# Patient Record
Sex: Male | Born: 2000 | Race: Black or African American | Hispanic: No | Smoking: Never smoker
Health system: Southern US, Community
[De-identification: ages and names within clinical notes are randomized; demographics above are authoritative.]

---

## 2019-06-03 ENCOUNTER — Other Ambulatory Visit: Payer: Self-pay

## 2019-06-03 ENCOUNTER — Encounter (HOSPITAL_COMMUNITY): Payer: Self-pay

## 2019-06-03 ENCOUNTER — Emergency Department (HOSPITAL_COMMUNITY)
Admission: EM | Admit: 2019-06-03 | Discharge: 2019-06-03 | Disposition: A | Discharge status: Home / Self Care | Payer: 59 | Attending: Emergency Medicine | Admitting: Emergency Medicine

## 2019-06-03 DIAGNOSIS — R262 Difficulty in walking, not elsewhere classified: Secondary | ICD-10-CM | POA: Diagnosis not present

## 2019-06-03 DIAGNOSIS — R0789 Other chest pain: Secondary | ICD-10-CM | POA: Insufficient documentation

## 2019-06-03 DIAGNOSIS — R079 Chest pain, unspecified: Secondary | ICD-10-CM

## 2019-06-03 LAB — CBC
HCT: 44.9 % (ref 39.0–52.0)
Hemoglobin: 14.8 g/dL (ref 13.0–17.0)
MCH: 28.8 pg (ref 26.0–34.0)
MCHC: 33 g/dL (ref 30.0–36.0)
MCV: 87.5 fL (ref 80.0–100.0)
Platelets: 258 10*3/uL (ref 150–400)
RBC: 5.13 MIL/uL (ref 4.22–5.81)
RDW: 13.5 % (ref 11.5–15.5)
WBC: 6.5 10*3/uL (ref 4.0–10.5)
nRBC: 0 % (ref 0.0–0.2)

## 2019-06-03 LAB — BASIC METABOLIC PANEL
Anion gap: 8 (ref 5–15)
BUN: 12 mg/dL (ref 6–20)
CO2: 27 mmol/L (ref 22–32)
Calcium: 10 mg/dL (ref 8.9–10.3)
Chloride: 104 mmol/L (ref 98–111)
Creatinine, Ser: 0.95 mg/dL (ref 0.61–1.24)
GFR calc Af Amer: >60 mL/min (ref >60)
GFR calc non Af Amer: >60 mL/min (ref >60)
Glucose, Bld: 94 mg/dL (ref 70–99)
Potassium: 3.7 mmol/L (ref 3.5–5.1)
Sodium: 139 mmol/L (ref 135–145)

## 2019-06-03 LAB — TROPONIN I (HIGH SENSITIVITY)
Troponin I (High Sensitivity): 5 ng/L (ref <18)
Troponin I (High Sensitivity): 6 ng/L (ref <18)

## 2019-06-03 MED ORDER — SODIUM CHLORIDE 0.9% FLUSH: 3.0000 mL | Freq: Once | INTRAVENOUS | Status: DC

## 2019-06-03 NOTE — ED Provider Notes (Signed)
[name] EMERGENCY DEPARTMENT Provider Note   CSN: [id number] Arrival date & time: 06/03/19  1523     History Chief Complaint  Patient presents with  . Chest Pain    Kent Wade is a 19 y.o. male.  19 year old male presents with complaint of ongoing chest pain.  Patient states he first developed chest pain in June of last year, states at that time he was also unable to walk for about a month and a half.  Patient has had extensive work-up with neurology, cardiology, hematology.  Patient states that he was told that he may have lymphoma or some autoimmune disorder however all of his tests so far have been normal.  Patient states that he continues to have a tightness on the left side of his chest described as muscle soreness from working out, pain is reproduced with palpation of the left chest, states sometimes sleeping helps with his pain as well.  Patient states that at times if he takes 4 5 deep breaths he feels a pop in his chest and then his pain improves.  No changes in his chronic/recurrent chest pain today.  Patient went to the emergency room in [place] last night due to ongoing pain in his chest however left after several hours.  Patient was contacted last night and told that his EKG was abnormal and he should present to the emergency room which prompted him to come to the ER today.        History reviewed. No pertinent past medical history.  There are no problems to display for this patient.   History reviewed. No pertinent surgical history.     No family history on file.  Social History   Tobacco Use  . Smoking status: Not on file  Substance Use Topics  . Alcohol use: Not on file  . Drug use: Not on file    Home Medications Prior to Admission medications   Not on File    Allergies    Patient has no known allergies.  Review of Systems   Review of Systems  Constitutional: Negative for fever.  HENT: Negative for congestion.     Respiratory: Positive for shortness of breath. Negative for cough.   Cardiovascular: Positive for chest pain. Negative for palpitations and leg swelling.  Gastrointestinal: Negative for nausea and vomiting.  Musculoskeletal: Negative for arthralgias and myalgias.  Skin: Negative for rash and wound.  Allergic/Immunologic: Negative for immunocompromised state.  Psychiatric/Behavioral: Negative for confusion.  All other systems reviewed and are negative.   Physical Exam Updated Vital Signs BP (!) 128/106   Pulse 62   Temp 98.1 F (36.7 C) (Oral)   Resp 16   SpO2 100%   Physical Exam Vitals and nursing note reviewed.  Constitutional:      General: He is not in acute distress.    Appearance: He is well-developed. He is not diaphoretic.  HENT:     Head: Normocephalic and atraumatic.  Cardiovascular:     Rate and Rhythm: Normal rate and regular rhythm.     Heart sounds: Normal heart sounds.  Pulmonary:     Effort: Pulmonary effort is normal.     Breath sounds: Normal breath sounds. No decreased breath sounds.  Chest:     Chest wall: Tenderness present.    Abdominal:     Palpations: Abdomen is soft.     Tenderness: There is no abdominal tenderness.  Musculoskeletal:     Right lower leg: No edema.  Left lower leg: No edema.  Skin:    General: Skin is warm and dry.     Findings: No erythema or rash.  Neurological:     Mental Status: He is alert and oriented to person, place, and time.  Psychiatric:        Behavior: Behavior normal.     ED Results / Procedures / Treatments   Labs (all labs ordered are listed, but only abnormal results are displayed) Labs Reviewed  BASIC METABOLIC PANEL  CBC  TROPONIN I (HIGH SENSITIVITY)  TROPONIN I (HIGH SENSITIVITY)    EKG EKG Interpretation  Date/Time:  Friday June 03 2019 15:34:20 EDT Ventricular Rate:  73 PR Interval:  164 QRS Duration: 90 QT Interval:  422 QTC Calculation: 464 R Axis:   98 Text  Interpretation: Sinus rhythm abnormal flipped biphasic anterior t wave inferior flipped t wave. abnormal, no old available Confirmed by Charlesetta Shanks [phone]) on 06/03/2019 5:20:01 PM   Radiology No results found.  Procedures Procedures (including critical care time)  Medications Ordered in ED Medications  sodium chloride flush (NS) 0.9 % injection 3 mL (3 mLs Intravenous Not Given 06/03/19 1934)    ED Course  I have reviewed the triage vital signs and the nursing notes.  Pertinent labs & imaging results that were available during my care of the patient were reviewed by me and considered in my medical decision making (see chart for details).  Clinical Course as of Jun 02 2120  Fri Jun 02, 6033  3960 19 year old male with complaint of chest pain since June of last year without changes in his chronic or baseline discomfort.  Patient was concerned because he went to an ER in [place] last night, had an EKG done and did not stay for evaluation, was contacted by the emergency room, told he had abnormal EKG to go to the ER.  Review of patient's EKG today, he does have flipped T waves however after further review of his extensive records on file through care everywhere, note from cardiology with [facility] commenting on same deep flipped T waves on prior EKG. Patient is also had an echo as well as CT scan of his chest. Case discussed with Dr. Vallery Ridge, ER attending, recommends consult with cardiology.  Case discussed with Dr. Ahmed Prima on-call with cardiology who has reviewed patient's records and EKG and recommends delta troponin, if no significant change, patient can follow-up with his cardiologist.   [LM]  2122 No significant change from initial troponin of 5-2nd troponin at 6.  Patient be discharged follow-up with his cardiologist.   [LM]    Clinical Course User Index [LM] Roque Lias   MDM Rules/Calculators/A&P                      Final Clinical Impression(s) / ED Diagnoses Final  diagnoses:  Chest pain, unspecified type    Rx / DC Orders ED Discharge Orders    None       Roque Lias 06/03/19 2122    Charlesetta Shanks, MD 06/13/19 [phone]

## 2019-06-03 NOTE — Discharge Instructions (Addendum)
Follow-up with your cardiologist in [place].  Present to emergency room for worsening or concerning symptoms.

## 2019-06-03 NOTE — ED Notes (Signed)
Discharge instructions discussed with pt and family at bedside. Pt verbalized understanding with no questions at this time. Pt to follow up with [facility] cardiology.

## 2019-06-03 NOTE — ED Triage Notes (Addendum)
Patient complains of chest pain that he describes as intermittently since June. Seen yesterday in [place] but didn't stay. NAD. No cough, no congestion. States has been several times in past several months for same

## 2020-04-04 ENCOUNTER — Encounter (HOSPITAL_COMMUNITY): Payer: Self-pay | Admitting: Emergency Medicine

## 2020-04-04 ENCOUNTER — Emergency Department (HOSPITAL_COMMUNITY): Payer: 59

## 2020-04-04 ENCOUNTER — Other Ambulatory Visit: Payer: Self-pay

## 2020-04-04 ENCOUNTER — Emergency Department (HOSPITAL_COMMUNITY)
Admission: EM | Admit: 2020-04-04 | Discharge: 2020-04-04 | Disposition: A | Discharge status: Home / Self Care | Payer: 59 | Attending: Emergency Medicine | Admitting: Emergency Medicine

## 2020-04-04 DIAGNOSIS — R079 Chest pain, unspecified: Secondary | ICD-10-CM | POA: Diagnosis present

## 2020-04-04 DIAGNOSIS — Z5321 Procedure and treatment not carried out due to patient leaving prior to being seen by health care provider: Secondary | ICD-10-CM | POA: Insufficient documentation

## 2020-04-04 LAB — CBC
HCT: 42.4 % (ref 39.0–52.0)
Hemoglobin: 14.5 g/dL (ref 13.0–17.0)
MCH: 29.4 pg (ref 26.0–34.0)
MCHC: 34.2 g/dL (ref 30.0–36.0)
MCV: 86 fL (ref 80.0–100.0)
Platelets: 254 10*3/uL (ref 150–400)
RBC: 4.93 MIL/uL (ref 4.22–5.81)
RDW: 13.2 % (ref 11.5–15.5)
WBC: 6.9 10*3/uL (ref 4.0–10.5)
nRBC: 0 % (ref 0.0–0.2)

## 2020-04-04 LAB — BASIC METABOLIC PANEL
Anion gap: 10 (ref 5–15)
BUN: 12 mg/dL (ref 6–20)
CO2: 25 mmol/L (ref 22–32)
Calcium: 9.5 mg/dL (ref 8.9–10.3)
Chloride: 102 mmol/L (ref 98–111)
Creatinine, Ser: 1.07 mg/dL (ref 0.61–1.24)
GFR, Estimated: >60 mL/min (ref >60)
Glucose, Bld: 108 mg/dL — ABNORMAL HIGH (ref 70–99)
Potassium: 3.7 mmol/L (ref 3.5–5.1)
Sodium: 137 mmol/L (ref 135–145)

## 2020-04-04 LAB — TROPONIN I (HIGH SENSITIVITY): Troponin I (High Sensitivity): 4 ng/L (ref <18)

## 2020-04-04 NOTE — ED Notes (Signed)
Pt had a friend come in to let us know he was LWBS

## 2020-04-04 NOTE — ED Triage Notes (Signed)
Patient reports intermittent left chest pain onset last month , denies SOB , no emesis or diaphoresis , denies fever or cough .

## 2020-07-18 ENCOUNTER — Emergency Department (HOSPITAL_COMMUNITY): Payer: 59

## 2020-07-18 ENCOUNTER — Other Ambulatory Visit: Payer: Self-pay

## 2020-07-18 ENCOUNTER — Ambulatory Visit (HOSPITAL_COMMUNITY)
Admission: EM | Admit: 2020-07-18 | Discharge: 2020-07-18 | Disposition: A | Discharge status: Other Acute Inpatient Hospital | Payer: 59

## 2020-07-18 ENCOUNTER — Encounter (HOSPITAL_COMMUNITY): Payer: Self-pay

## 2020-07-18 ENCOUNTER — Emergency Department (HOSPITAL_COMMUNITY)
Admission: EM | Admit: 2020-07-18 | Discharge: 2020-07-19 | Disposition: A | Discharge status: Home / Self Care | Payer: 59 | Attending: Emergency Medicine | Admitting: Emergency Medicine

## 2020-07-18 DIAGNOSIS — R519 Headache, unspecified: Secondary | ICD-10-CM | POA: Diagnosis not present

## 2020-07-18 DIAGNOSIS — Z91010 Allergy to peanuts: Secondary | ICD-10-CM | POA: Diagnosis not present

## 2020-07-18 DIAGNOSIS — R070 Pain in throat: Secondary | ICD-10-CM

## 2020-07-18 DIAGNOSIS — J039 Acute tonsillitis, unspecified: Secondary | ICD-10-CM | POA: Insufficient documentation

## 2020-07-18 DIAGNOSIS — J029 Acute pharyngitis, unspecified: Secondary | ICD-10-CM | POA: Diagnosis present

## 2020-07-18 LAB — CBC WITH DIFFERENTIAL/PLATELET
Abs Immature Granulocytes: 0.09 10*3/uL — ABNORMAL HIGH (ref 0.00–0.07)
Basophils Absolute: 0 10*3/uL (ref 0.0–0.1)
Basophils Relative: 0 %
Eosinophils Absolute: 0 10*3/uL (ref 0.0–0.5)
Eosinophils Relative: 0 %
HCT: 44.5 % (ref 39.0–52.0)
Hemoglobin: 14.6 g/dL (ref 13.0–17.0)
Immature Granulocytes: 1 %
Lymphocytes Relative: 15 %
Lymphs Abs: 2.2 10*3/uL (ref 0.7–4.0)
MCH: 28.7 pg (ref 26.0–34.0)
MCHC: 32.8 g/dL (ref 30.0–36.0)
MCV: 87.6 fL (ref 80.0–100.0)
Monocytes Absolute: 1.4 10*3/uL — ABNORMAL HIGH (ref 0.1–1.0)
Monocytes Relative: 10 %
Neutro Abs: 10.4 10*3/uL — ABNORMAL HIGH (ref 1.7–7.7)
Neutrophils Relative %: 74 %
Platelets: 249 10*3/uL (ref 150–400)
RBC: 5.08 MIL/uL (ref 4.22–5.81)
RDW: 13.9 % (ref 11.5–15.5)
WBC: 14.1 10*3/uL — ABNORMAL HIGH (ref 4.0–10.5)
nRBC: 0 % (ref 0.0–0.2)

## 2020-07-18 LAB — BASIC METABOLIC PANEL
Anion gap: 8 (ref 5–15)
BUN: 9 mg/dL (ref 6–20)
CO2: 25 mmol/L (ref 22–32)
Calcium: 10.1 mg/dL (ref 8.9–10.3)
Chloride: 102 mmol/L (ref 98–111)
Creatinine, Ser: 1.15 mg/dL (ref 0.61–1.24)
GFR, Estimated: >60 mL/min (ref >60)
Glucose, Bld: 94 mg/dL (ref 70–99)
Potassium: 3.9 mmol/L (ref 3.5–5.1)
Sodium: 135 mmol/L (ref 135–145)

## 2020-07-18 LAB — GROUP A STREP BY PCR: Group A Strep by PCR: NOT DETECTED

## 2020-07-18 MED ORDER — IBUPROFEN 100 MG/5ML PO SUSP
600.0000 mg | Freq: Once | ORAL | Status: AC
  Administered 2020-07-18: 600 mg via ORAL

## 2020-07-18 MED ORDER — DEXAMETHASONE SODIUM PHOSPHATE 10 MG/ML IJ SOLN
10.0000 mg | Freq: Once | INTRAMUSCULAR | Status: AC
  Administered 2020-07-18: 10 mg via INTRAMUSCULAR
  Filled 2020-07-18: qty 1

## 2020-07-18 MED ORDER — ACETAMINOPHEN 160 MG/5ML PO SOLN
650.0000 mg | Freq: Once | ORAL | Status: AC
  Administered 2020-07-18: 650 mg via ORAL
  Filled 2020-07-18: qty 20.3

## 2020-07-18 MED ORDER — IBUPROFEN 100 MG/5ML PO SUSP
ORAL | Status: AC
  Filled 2020-07-18: qty 5

## 2020-07-18 MED ORDER — AMOXICILLIN-POT CLAVULANATE 400-57 MG/5ML PO SUSR
875.0000 mg | Freq: Once | ORAL | Status: AC
  Administered 2020-07-19: 875 mg via ORAL
  Filled 2020-07-18: qty 10.9

## 2020-07-18 MED ORDER — IBUPROFEN 100 MG/5ML PO SUSP
600.0000 mg | Freq: Once | ORAL | Status: AC
  Administered 2020-07-19: 600 mg via ORAL
  Filled 2020-07-18: qty 30

## 2020-07-18 MED ORDER — DEXAMETHASONE SODIUM PHOSPHATE 10 MG/ML IJ SOLN
6.0000 mg | Freq: Once | INTRAMUSCULAR | Status: AC
  Administered 2020-07-18: 6 mg via INTRAVENOUS
  Filled 2020-07-18: qty 1

## 2020-07-18 MED ORDER — AMOXICILLIN-POT CLAVULANATE 400-57 MG/5ML PO SUSR: 875.0000 mg | Freq: Two times a day (BID) | ORAL | 0 refills | Status: AC

## 2020-07-18 MED ORDER — SODIUM CHLORIDE 0.9 % IV BOLUS
2000.0000 mL | Freq: Once | INTRAVENOUS | Status: AC
  Administered 2020-07-18: 2000 mL via INTRAVENOUS

## 2020-07-18 NOTE — ED Provider Notes (Signed)
[name] EMERGENCY DEPARTMENT Provider Note   CSN: [id number] Arrival date & time: 07/18/20  1437     History No chief complaint on file.   Kent Wade is a 20 y.o. male with no pertinent past medical history presents today for evaluation of 3 days of sore throat, headache, bilateral ear pain and throat pain.  He states that over the past 2 to 3 days he has had worsening pain and difficulty swallowing his saliva.  He denies any shortness of breath.  He was febrile here at 100.4, however denies checking his temperature at home or prior to arrival.  He denies any history of similar. He denies any known sick contacts.  No reported rashes.  He states that he has not had any known mono exposures or any concern for STI.  HPI     History reviewed. No pertinent past medical history.  There are no problems to display for this patient.   History reviewed. No pertinent surgical history.     No family history on file.  Social History   Tobacco Use  . Smoking status: Never Smoker  . Smokeless tobacco: Never Used  Substance Use Topics  . Alcohol use: Never  . Drug use: Never    Home Medications Prior to Admission medications   Medication Sig Start Date End Date Taking? Authorizing Provider  amoxicillin-clavulanate (AUGMENTIN) 400-57 MG/5ML suspension Take 10.9 mLs (875 mg total) by mouth 2 (two) times daily for 10 days. 07/18/20 07/28/20 Yes Cristina Gong, PA-C  cetirizine (ZYRTEC) 10 MG tablet Take 10 mg by mouth daily as needed for allergies.   Yes [provider]  ibuprofen (ADVIL) 200 MG tablet Take 400 mg by mouth every 6 (six) hours as needed for headache or moderate pain.   Yes [provider]    Allergies    Chocolate flavor and Peanut butter flavor  Review of Systems   Review of Systems  Constitutional: Positive for chills and fever.  HENT: Positive for ear pain, sore throat, trouble swallowing and voice change.   Eyes:  Negative for visual disturbance.  Respiratory: Negative for chest tightness and shortness of breath.   Cardiovascular: Negative for chest pain.  Gastrointestinal: Negative for abdominal pain, nausea and vomiting.  Genitourinary: Negative for dysuria.  Neurological: Positive for headaches.  All other systems reviewed and are negative.   Physical Exam Updated Vital Signs BP 127/74   Pulse 64   Temp 97.8 F (36.6 C) (Oral)   Resp 16   SpO2 100%   Physical Exam Vitals and nursing note reviewed.  Constitutional:      General: He is not in acute distress.    Appearance: He is not diaphoretic.  HENT:     Head: Normocephalic and atraumatic.     Mouth/Throat:     Comments: Patient has significant trismus, unable to clearly view tonsils or uvula.  He has muffled/hot potato sounding voice. Eyes:     General: No scleral icterus.       Right eye: No discharge.        Left eye: No discharge.     Conjunctiva/sclera: Conjunctivae normal.  Cardiovascular:     Rate and Rhythm: Normal rate and regular rhythm.  Pulmonary:     Effort: Pulmonary effort is normal. No respiratory distress.     Breath sounds: No stridor.  Abdominal:     General: There is no distension.  Musculoskeletal:        General: No deformity.  Cervical back: Normal range of motion.  Skin:    General: Skin is warm and dry.  Neurological:     Mental Status: He is alert.     Motor: No abnormal muscle tone.  Psychiatric:        Behavior: Behavior normal.     ED Results / Procedures / Treatments   Labs (all labs ordered are listed, but only abnormal results are displayed) Labs Reviewed  CBC WITH DIFFERENTIAL/PLATELET - Abnormal; Notable for the following components:      Result Value   WBC 14.1 (*)    Neutro Abs 10.4 (*)    Monocytes Absolute 1.4 (*)    Abs Immature Granulocytes 0.09 (*)    All other components within normal limits  GROUP A STREP BY PCR  BASIC METABOLIC PANEL     EKG None  Radiology CT Soft Tissue Neck Wo Contrast  Result Date: 07/18/2020 CLINICAL DATA:  Initial evaluation for acute sore throat for 3 days, epiglottitis or tonsillitis suspected. EXAM: CT NECK WITHOUT CONTRAST TECHNIQUE: Multidetector CT imaging of the neck was performed following the standard protocol without intravenous contrast. COMPARISON:  None available. FINDINGS: Pharynx and larynx: Examination somewhat technically limited by motion artifact and lack of IV contrast. Oral cavity within normal limits. No acute inflammatory changes seen about the dentition. Tonsils are mildly prominent and hypertrophied bilaterally, with the right slightly larger than the left. No visible discrete tonsillar or peritonsillar abscess. Parapharyngeal fat fairly well maintained. Remainder of the nasopharynx and oropharynx within normal limits. No retropharyngeal collection or swelling. Epiglottis grossly within normal limits on this motion degraded exam. Remainder of the hypopharynx and supraglottic larynx within normal limits. Glottis normal. Subglottic airway patent clear. Salivary glands: Salivary glands including the parotid and submandibular glands are grossly within normal limits allowing for motion and lack of IV contrast. Thyroid: Normal. Lymph nodes: No visible enlarged or pathologic adenopathy within the neck. Vascular: Evaluation of the vascular structures limited by lack of IV contrast. Limited intracranial: Unremarkable. Visualized orbits: Unremarkable. Mastoids and visualized paranasal sinuses: Minimal mucosal thickening within the left sphenoid sinus. Visualized paranasal sinuses are otherwise clear. Visualized mastoid air cells and middle ear cavities are well pneumatized and free of fluid. Skeleton: No acute osseous finding. No discrete or worrisome osseous lesions. Upper chest: Visualized upper chest demonstrates no acute finding. Visualized lungs are clear. Other: None. IMPRESSION: 1. Mildly  prominent and hypertrophied palatine tonsils bilaterally, nonspecific, but can be seen in the setting of acute tonsillitis. No discrete tonsillar or peritonsillar abscess. 2. No other acute abnormality identified within the neck. Electronically Signed   By: Rise Mu M.D.   On: 07/18/2020 18:57    Procedures Procedures   Medications Ordered in ED Medications  dexamethasone (DECADRON) injection 10 mg (10 mg Intramuscular Given 07/18/20 1506)  ibuprofen (ADVIL) 100 MG/5ML suspension 600 mg (600 mg Oral Given 07/18/20 1506)  sodium chloride 0.9 % bolus 2,000 mL (0 mLs Intravenous Stopped 07/19/20 0028)  acetaminophen (TYLENOL) 160 MG/5ML solution 650 mg (650 mg Oral Given 07/18/20 1825)  dexamethasone (DECADRON) injection 6 mg (6 mg Intravenous Given 07/18/20 2121)  amoxicillin-clavulanate (AUGMENTIN) 400-57 MG/5ML suspension 875 mg (875 mg Oral Given 07/19/20 0025)  ibuprofen (ADVIL) 100 MG/5ML suspension 600 mg (600 mg Oral Given 07/19/20 0026)    ED Course  I have reviewed the triage vital signs and the nursing notes.  Pertinent labs & imaging results that were available during my care of the patient were reviewed by me  and considered in my medical decision making (see chart for details).  Clinical Course as of 07/20/20 1547  Wed Jul 18, 2020  2028 I went to reevaluate patient.  It appears that his IV has been nonfunctional and he has not gotten more than 100 cc of the 2 L fluids I ordered.  Asked RN to troubleshoot IV and start new 1 if needed. [EH]  2206 Patient is reevaluated, he says that his throat does not hurt anymore.  He has gotten about one of the 2 L that I have ordered.  He is again instructed to keep his arm straight so that it will flow well.  He is no longer constantly spitting up his saliva. [EH]  2258 Patient is reevaluated.  He has now received about 1.5 of the 2 L.  He still looks better, has better phonation and still feels like he is having to spit his saliva out.   He is given ice chips to try and swallow.   [EH]    Clinical Course User Index [EH] Norman Clay   MDM Rules/Calculators/A&P                         Patient is a 20 year old man who presents today for evaluation of sore throat. Patient had significant trismus limiting ability to evaluate his throat and was not tolerating secretions.  He was given IM Decadron in triage.  It took him a long time to receive his IV fluids extending his care in the ER. While in the ER he was given an extra 6 mg IV Decadron due to continued difficulty swallowing, he was given 2 doses of ibuprofen, dose of Tylenol.  CT scan was obtained which did not show abscess or drainable fluid collection.  His strep test was negative, however he does have mild leukocytosis and is borderline febrile here.  His airway is intact, he is able to breathe without difficulty, no stridor, and is 100% on room air.  He is given p.o. antibiotics. At this point patient is still having difficulty swallowing his saliva.  He additionally still needs completion of his IV fluids.  At shift change care was transferred to [facility] who will follow pending studies, re-evaulate and determine disposition.     Note: Portions of this report may have been transcribed using voice recognition software. Every effort was made to ensure accuracy; however, inadvertent computerized transcription errors may be present  Final Clinical Impression(s) / ED Diagnoses Final diagnoses:  Throat pain  Tonsillitis    Rx / DC Orders ED Discharge Orders         Ordered    amoxicillin-clavulanate (AUGMENTIN) 400-57 MG/5ML suspension  2 times daily        07/18/20 2314           Cristina Gong, Cordelia Poche 07/20/20 1550    Pricilla Loveless, MD 07/21/20 [phone]

## 2020-07-18 NOTE — ED Triage Notes (Signed)
Patient sent from urgent care for further evaluation of sore throat x 3 days and unable to eat and drink. Patient has had fever for same and complains of headache and bilateral ear pain

## 2020-07-18 NOTE — ED Notes (Signed)
Attempted to call report to ED.  

## 2020-07-18 NOTE — ED Provider Notes (Signed)
Emergency Medicine Provider Triage Evaluation Note  Kent Wade , a 20 y.o. male  was evaluated in triage.  Pt complains of sore throat, headache, ear pain. Sen at urgent care pta and sent here for hydration and further eval. States he hasnt eaten in 3 days because he cannot swallow anything including liquids.  Review of Systems  Positive: Sore throat, ear pain, headache Negative: cough  Physical Exam  BP (!) 147/74   Pulse 98   Temp 100 F (37.8 C)   SpO2 100%  Gen:   Awake, no distress   Resp:  Normal effort  MSK:   Moves extremities without difficulty  Other:  Airway patent, trismus noted which limits exam, no obvious unilateral tonsillar swelling but tonsillar edema is noted bilaterally  Medical Decision Making  Medically screening exam initiated at 2:54 PM.  Appropriate orders placed.  Kent Wade was informed that the remainder of the evaluation will be completed by another provider, this initial triage assessment does not replace that evaluation, and the importance of remaining in the ED until their evaluation is complete.    Kent Wade 07/18/20 1506    Jacalyn Lefevre, MD 07/19/20 1725

## 2020-07-18 NOTE — ED Triage Notes (Signed)
Pt presents unable to talk due to swelling in throat . Richrd Prime ,PA in room to assess Pt needs. Pt is texting on cell phone to give answers.Pt unable to open mouth fully for exam. Pt A/O no resp distress noted.

## 2020-07-18 NOTE — Discharge Instructions (Addendum)
Please take Ibuprofen (Advil, motrin) and Tylenol (acetaminophen) to relieve your pain.    You may take up to 600 MG (3 pills) of normal strength ibuprofen every 8 hours as needed.   You make take tylenol, up to 1,000 mg (two extra strength pills) every 8 hours as needed.  These medications also come in a liquid form if you do not feel that you are able to swallow a pill.  It is safe to take ibuprofen and tylenol at the same time as they work differently.   Do not take more than 3,000 mg tylenol in a 24 hour period (not more than one dose every 8 hours.  Please check all medication labels as many medications such as pain and cold medications may contain tylenol.  Do not drink alcohol while taking these medications.  Do not take other NSAID'S while taking ibuprofen (such as aleve or naproxen).  Please take ibuprofen with food to decrease stomach upset.  You may have diarrhea from the antibiotics.  It is very important that you continue to take the antibiotics even if you get diarrhea unless a medical professional tells you that you may stop taking them.  If you stop too early the bacteria you are being treated for will become stronger and you may need different, more powerful antibiotics that have more side effects and worsening diarrhea.  Please stay well hydrated and consider probiotics as they may decrease the severity of your diarrhea.

## 2020-07-19 NOTE — ED Provider Notes (Signed)
20 year old male received at sign out from [facility] pending medications and reevaluation.  Per her HPI:  "Kent Wade is a 20 y.o. male with no pertinent past medical history presents today for evaluation of 3 days of sore throat, headache, bilateral ear pain and throat pain.  Kent Wade states that over the past 2 to 3 days Kent Wade has had worsening pain and difficulty swallowing his saliva.  Kent Wade denies any shortness of breath.  Kent Wade was febrile here at 100.4, however denies checking his temperature at home or prior to arrival."  Physical Exam  BP 125/78   Pulse 64   Temp 100 F (37.8 C)   Resp 16   SpO2 98%   Physical Exam Vitals and nursing note reviewed.  Constitutional:      Appearance: Kent Wade is well-developed.     Comments: Patient holding an empty cup  HENT:     Head: Normocephalic and atraumatic.     Mouth/Throat:     Comments: Phonation is normal.  Able to speak in complete, fluent sentences.  Tolerating secretions without difficulty. Cardiovascular:     Rate and Rhythm: Normal rate.  Pulmonary:     Effort: Pulmonary effort is normal.     Comments: No increased work of breathing. Musculoskeletal:     Cervical back: Neck supple.  Neurological:     Mental Status: Kent Wade is alert and oriented to person, place, and time.  Psychiatric:        Behavior: Behavior normal.     ED Course/Procedures   Clinical Course as of 07/19/20 0113  Wed Jul 18, 2020  2028 I went to reevaluate patient.  It appears that his IV has been nonfunctional and Kent Wade has not gotten more than 100 cc of the 2 L fluids I ordered.  Asked RN to troubleshoot IV and start new 1 if needed. [EH]  2206 Patient is reevaluated, Kent Wade says that his throat does not hurt anymore.  Kent Wade has gotten about one of the 2 L that I have ordered.  Kent Wade is again instructed to keep his arm straight so that it will flow well.  Kent Wade is no longer constantly spitting up his saliva. [EH]  2258 Patient is reevaluated.  Kent Wade has now received about 1.5 of the 2 L.   Kent Wade still looks better, has better phonation and still feels like Kent Wade is having to spit his saliva out.  Kent Wade is given ice chips to try and swallow.   [EH]    Clinical Course User Index [EH] Cristina Gong, PA-C    Procedures  MDM  20 year old male received a signout from [facility] [place] pending medication and reevaluation.  Please see her note for further medical work-up and decision making.  In brief, this is a an otherwise healthy 20 year old male with a 3-day history of sore throat, headache, otalgia.  Previously, Kent Wade was stating that Kent Wade was unable to tolerate his own secretions.  CT scan was obtained with no evidence of peritonsillar abscess.  Doubt streptococcal pharyngitis, retropharyngeal abscess, Ludwick's angina.  Kent Wade was given dexamethasone, fluids, and analgesia.  Strep PCR was negative.  On re-evaluation, Kent Wade is now tolerating secretions without difficulty.  Kent Wade reports Kent Wade is feeling significantly improved.  Kent Wade is drinking fluids without difficulty.  Kent Wade has no complaints at this time.  Will discharge home per plan by PA Jeraldine Loots.  ER return precautions given.  Kent Wade is hemodynamically stable in no acute distress.  Safer discharge home with outpatient follow-up as discussed.  Barkley Boards, PA-C 07/19/20 0114    Marily Memos, MD 07/19/20 [phone]

## 2021-11-23 IMAGING — CT CT NECK W/O CM
5 of 6 series · 13 of 33 positions shown, 15 images · non-contrast
Comparison: None available.

CLINICAL DATA: Initial evaluation for acute sore throat for 3 days,
epiglottitis or tonsillitis suspected.

EXAM:
CT NECK WITHOUT CONTRAST
TECHNIQUE: Multidetector CT imaging of the neck was performed following the
standard protocol without intravenous contrast.

[Series 3: axial neck (person_name) · axial · 0.54mm/px · z∈[-300,-226]mm · 2 of 113 slices shown, 3 images]
[im 38/113  soft-tissue]
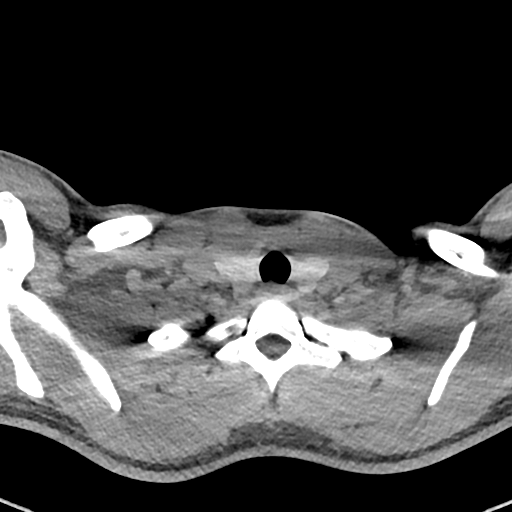
[im 38/113  bone]
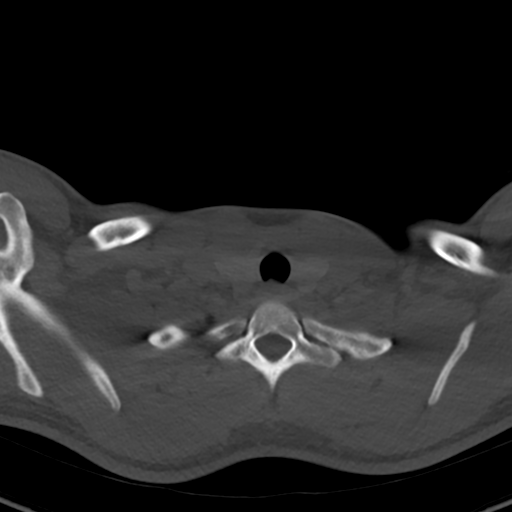
[im 75/113  bone]
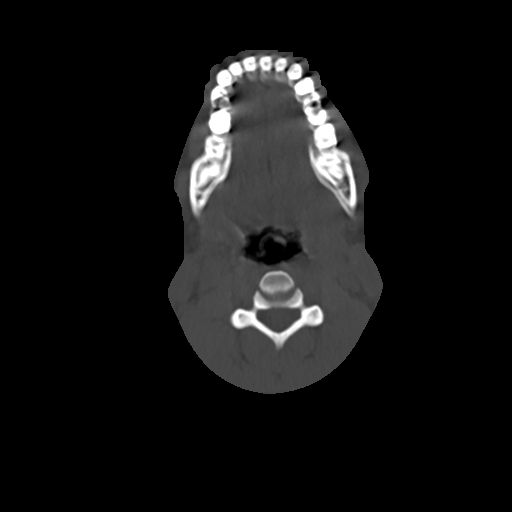

[Series 5: axial bone · axial · 0.54mm/px · z∈[-300,-224]mm · 2 of 114 slices shown]
[im 38/114  bone]
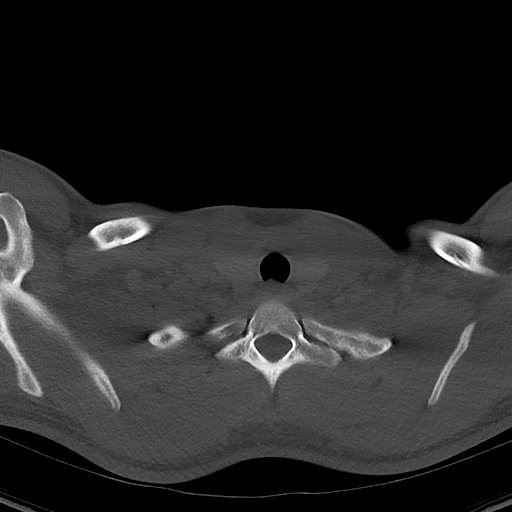
[im 76/114  bone]
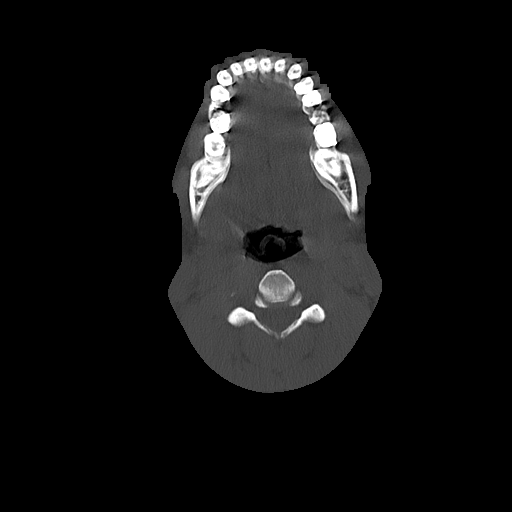

[Series 6: sag neck (person_name) · sagittal · 0.44mm/px · 5 of 93 slices shown, 6 images]
[im 31/93  bone]
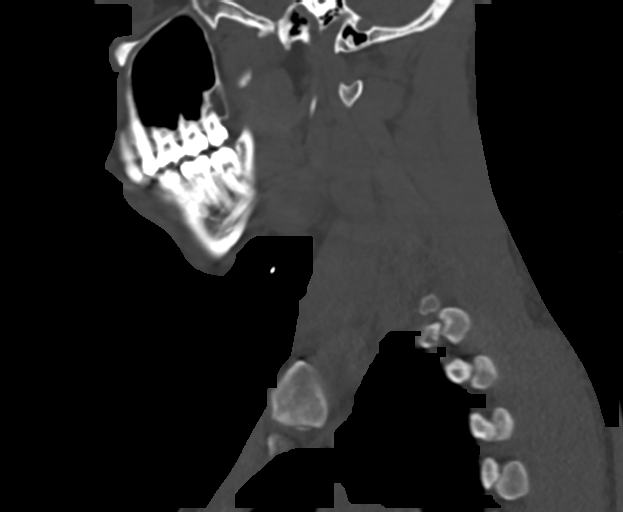
[im 39/93  bone]
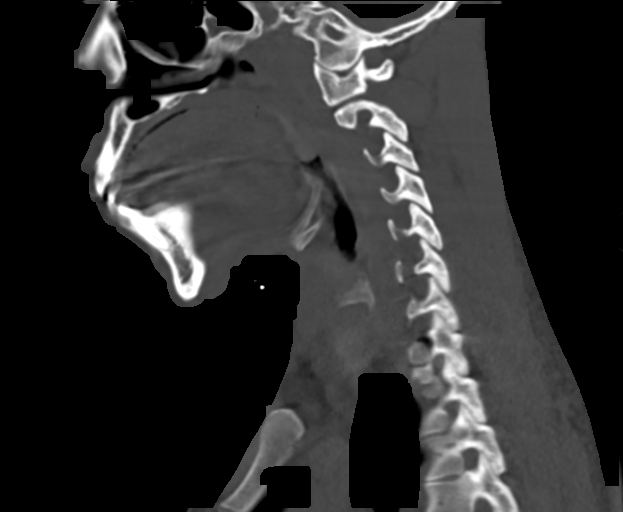
[im 47/93  soft-tissue]
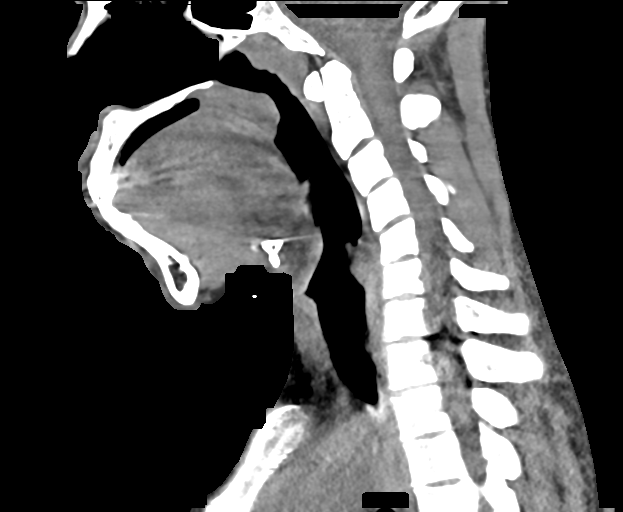
[im 47/93  bone]
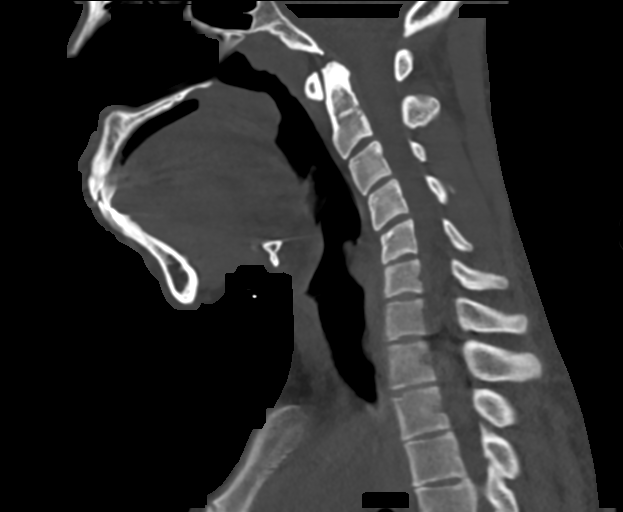
[im 54/93  bone]
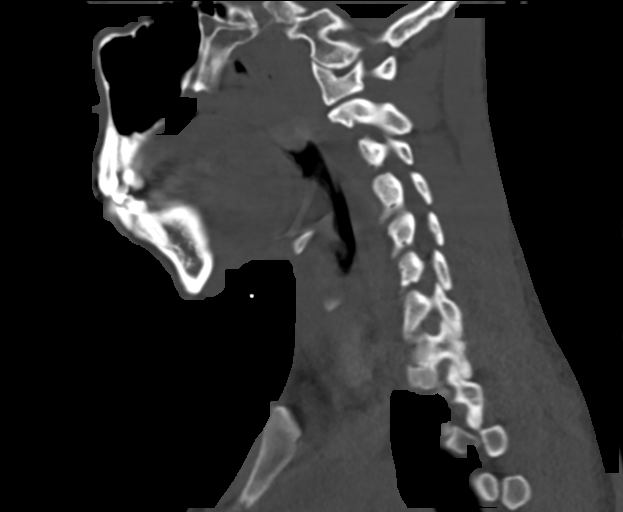
[im 62/93  bone]
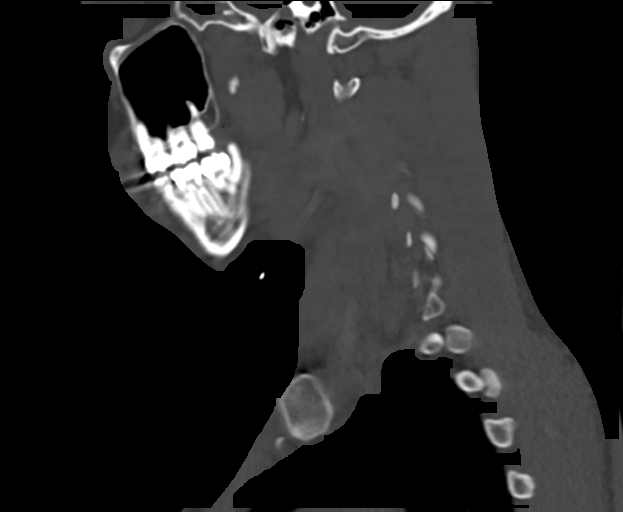

[Series 7: cor neck (person_name) · coronal · 0.34mm/px · 3 of 133 slices shown]
[im 27/133  bone]
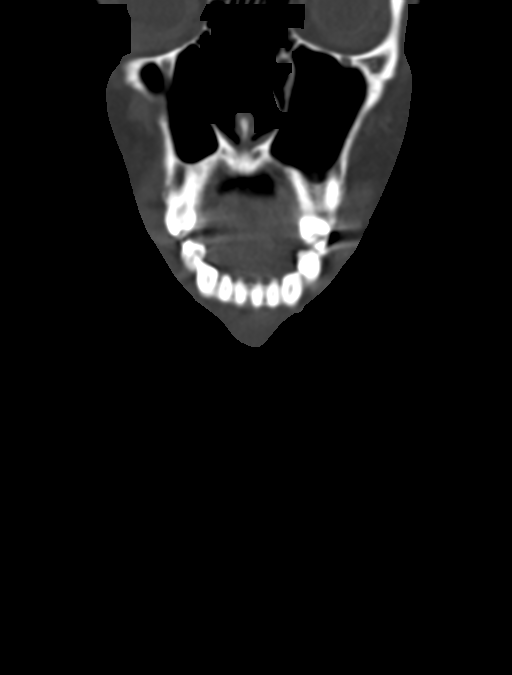
[im 53/133  bone]
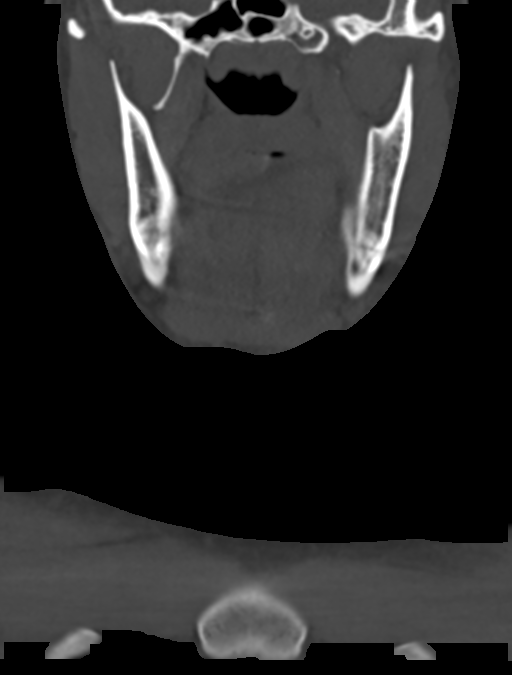
[im 80/133  bone]
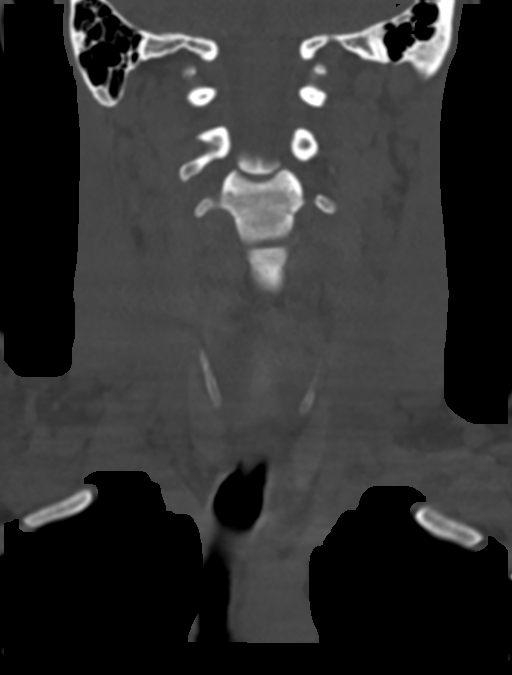

[Series 8: ax oropharynx (person_name) · axial · 0.34mm/px · 1 of 115 slices shown]
[im 39/115  bone]
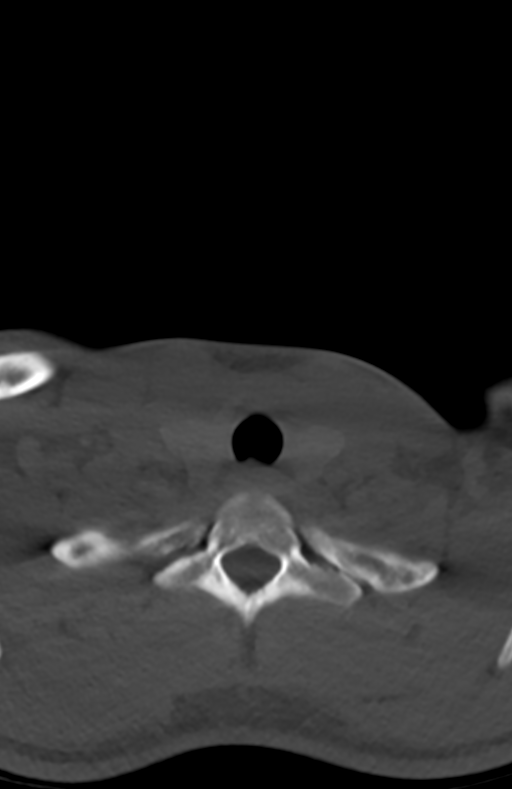

[13 of 33 positions shown; findings below may reference images not displayed]

FINDINGS: Pharynx and larynx: Examination somewhat technically limited by
motion artifact and lack of IV contrast.

Oral cavity within normal limits. No acute inflammatory changes seen
about the dentition. Tonsils are mildly prominent and hypertrophied
bilaterally, with the right slightly larger than the left. No
visible discrete tonsillar or peritonsillar abscess. Parapharyngeal
fat fairly well maintained. Remainder of the nasopharynx and
oropharynx within normal limits. No retropharyngeal collection or
swelling. Epiglottis grossly within normal limits on this motion
degraded exam. Remainder of the hypopharynx and supraglottic larynx
within normal limits. Glottis normal. Subglottic airway patent
clear.

Salivary glands: Salivary glands including the parotid and
submandibular glands are grossly within normal limits allowing for
motion and lack of IV contrast.

Thyroid: Normal.

Lymph nodes: No visible enlarged or pathologic adenopathy within the
neck.

Vascular: Evaluation of the vascular structures limited by lack of
IV contrast.

Limited intracranial: Unremarkable.

Visualized orbits: Unremarkable.

Mastoids and visualized paranasal sinuses: Minimal mucosal
thickening within the left sphenoid sinus. Visualized paranasal
sinuses are otherwise clear. Visualized mastoid air cells and middle
ear cavities are well pneumatized and free of fluid.

Skeleton: No acute osseous finding. No discrete or worrisome osseous
lesions.

Upper chest: Visualized upper chest demonstrates no acute finding.
Visualized lungs are clear.

Other: None.
IMPRESSION: 1. Mildly prominent and hypertrophied palatine tonsils bilaterally,
nonspecific, but can be seen in the setting of acute tonsillitis. No
discrete tonsillar or peritonsillar abscess.
2. No other acute abnormality identified within the neck.
